# Patient Record
Sex: Male | Born: 1985 | Race: White | Hispanic: No | Marital: Single | Smoking: Current every day smoker
Health system: Southern US, Community
[De-identification: ages and names within clinical notes are randomized; demographics above are authoritative.]

## PROBLEM LIST (undated history)

## (undated) HISTORY — PX: TONSILLECTOMY: SUR1361

---
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
## 2009-08-22 ENCOUNTER — Emergency Department (HOSPITAL_COMMUNITY): Admission: EM | Admit: 2009-08-22 | Discharge: 2009-08-22 | Payer: Self-pay | Admitting: Emergency Medicine

## 2013-07-27 ENCOUNTER — Encounter (HOSPITAL_COMMUNITY): Payer: Self-pay | Admitting: *Deleted

## 2013-07-27 ENCOUNTER — Emergency Department (INDEPENDENT_AMBULATORY_CARE_PROVIDER_SITE_OTHER)
Admission: EM | Admit: 2013-07-27 | Discharge: 2013-07-27 | Disposition: A | Discharge status: Home / Self Care | Payer: Self-pay | Source: Home / Self Care | Attending: Family Medicine | Admitting: Family Medicine

## 2013-07-27 DIAGNOSIS — S61219A Laceration without foreign body of unspecified finger without damage to nail, initial encounter: Secondary | ICD-10-CM

## 2013-07-27 DIAGNOSIS — S61209A Unspecified open wound of unspecified finger without damage to nail, initial encounter: Secondary | ICD-10-CM

## 2013-07-27 DIAGNOSIS — Z23 Encounter for immunization: Secondary | ICD-10-CM

## 2013-07-27 MED ORDER — HYDROCODONE-ACETAMINOPHEN 5-325 MG PO TABS: 1.0000 | ORAL_TABLET | Freq: Four times a day (QID) | ORAL | Status: DC | PRN

## 2013-07-27 MED ORDER — TETANUS-DIPHTH-ACELL PERTUSSIS 5-2.5-18.5 LF-MCG/0.5 IM SUSP
0.5000 mL | Freq: Once | INTRAMUSCULAR | Status: AC
  Administered 2013-07-27: 0.5 mL via INTRAMUSCULAR

## 2013-07-27 MED ORDER — LIDOCAINE-EPINEPHRINE-TETRACAINE (LET) SOLUTION
3.0000 mL | Freq: Once | NASAL | Status: AC
  Administered 2013-07-27: 15:00:00 3 mL via TOPICAL

## 2013-07-27 MED ORDER — LIDOCAINE-EPINEPHRINE-TETRACAINE (LET) SOLUTION
NASAL | Status: AC
  Filled 2013-07-27: qty 3

## 2013-07-27 MED ORDER — TETANUS-DIPHTH-ACELL PERTUSSIS 5-2.5-18.5 LF-MCG/0.5 IM SUSP
INTRAMUSCULAR | Status: AC
  Filled 2013-07-27: qty 0.5

## 2013-07-27 NOTE — ED Notes (Signed)
LET applied to finger. Pt pain resolved almost immediately. Bleeding continues.

## 2013-07-27 NOTE — ED Notes (Signed)
Ice pack applied to right index finger.

## 2013-07-27 NOTE — ED Notes (Signed)
Pt states pain is slowly starting to return.

## 2013-07-27 NOTE — ED Notes (Signed)
Pt has laceration on index finger of right hand.  Pt states he cut his finger while chopping onions.

## 2013-07-27 NOTE — ED Provider Notes (Signed)
Travis Bright is a 27 y.o. male who presents to Urgent Care today for fingertip laceration. Patient was preparing a meal when he accidentally cut a piece of his left radial side index finger. He applied a compression dressing and used kitchen wrap to apply a tourniquet to his finger. This controlled the bleeding. He presented to the urgent care approximately 30 minutes later. He notes considerable pain. He denies any fevers nausea vomiting or diarrhea. He cannot recall his last tetanus shot.   History reviewed. No pertinent past medical history. History  Substance Use Topics  . Smoking status: Current Every Day Smoker -- 1.00 packs/day  . Smokeless tobacco: Not on file  . Alcohol Use: Yes   ROS as above Medications reviewed. No current facility-administered medications for this encounter.   Current Outpatient Prescriptions  Medication Sig Dispense Refill  . HYDROcodone-acetaminophen (NORCO/VICODIN) 5-325 MG per tablet Take 1 tablet by mouth every 6 (six) hours as needed for pain.  20 tablet  0    Exam:  BP 130/66  Pulse 78  Temp(Src) 98.1 F (36.7 C) (Oral)  Resp 16  SpO2 99%  Gen: Well NAD LEFT INDEX FINGER: Radial sided laceration involving the distal phalanx. It is approximately 1.5 cm long by 0.5 cm wide. There is no remaining skin flap.   Sensation is intact distally.   Wound care: LET was applied to control bleeding and for pain relief.  Hemostatic material was applied to the wound and a bulky compression dressing was applied.   Assessment and Plan: 27 y.o. male with nonrepairable left index fingertip laceration.  The wound edges are too far apart and there is no skin laxity at the fingertip. This wound will not be able to be repaired with primary intent.  Will allow to heal with secondary intent.  Provide tetanus shot and Norco for pain control.  Followup in one week or sooner if needed.  Discussed warning signs or symptoms. Please see discharge instructions.  Patient expresses understanding.      Rodolph Bong, MD 07/27/13 [id number]

## 2014-09-25 ENCOUNTER — Encounter (HOSPITAL_COMMUNITY): Payer: Self-pay | Admitting: Emergency Medicine

## 2014-09-25 ENCOUNTER — Emergency Department (INDEPENDENT_AMBULATORY_CARE_PROVIDER_SITE_OTHER)
Admission: EM | Admit: 2014-09-25 | Discharge: 2014-09-25 | Disposition: A | Discharge status: Home / Self Care | Payer: Self-pay | Source: Home / Self Care | Attending: Emergency Medicine | Admitting: Emergency Medicine

## 2014-09-25 DIAGNOSIS — S81812A Laceration without foreign body, left lower leg, initial encounter: Secondary | ICD-10-CM

## 2014-09-25 MED ORDER — CEPHALEXIN 500 MG PO CAPS: 500.0000 mg | ORAL_CAPSULE | Freq: Four times a day (QID) | ORAL | Status: DC

## 2014-09-25 NOTE — ED Notes (Signed)
Laceration to left calf, occurred last night with a kitchen knife.  Dropped knife and made contact with calf of leg

## 2014-09-25 NOTE — ED Provider Notes (Signed)
CSN: [id number]     Arrival date & time 09/25/14  1218 History   First MD Initiated Contact with Patient 09/25/14 1239     No chief complaint on file.  (Consider location/radiation/quality/duration/timing/severity/associated sxs/prior Treatment) HPI Comments: Dropped a knife last PM at 8 PM and it punctured and lacerated the skin over the left calf. Did not [facility]ek medical attn. Last TT this yr No signs of infection.     No past medical history on file. No past surgical history on file. No family history on file. History  Substance Use Topics  . Smoking status: Current Every Day Smoker -- 1.00 packs/day  . Smokeless tobacco: Not on file  . Alcohol Use: Yes    Review of Systems  Constitutional: Negative.   Musculoskeletal: Negative.   Skin: Positive for wound.    Allergies  Review of patient's allergies indicates not on file.  Home Medications   Prior to Admission medications   Medication Sig Start Date End Date Taking? Authorizing Provider  cephALEXin (KEFLEX) 500 MG capsule Take 1 capsule (500 mg total) by mouth 4 (four) times daily. 09/25/14   Hayden Rasmussenavid Korvin Valentine, NP  HYDROcodone-acetaminophen (NORCO/VICODIN) 5-325 MG per tablet Take 1 tablet by mouth every 6 (six) hours as needed for pain. 07/27/13   Rodolph BongEvan S Corey, MD   BP 124/74 mmHg  Pulse 72  Temp(Src) 98.5 F (36.9 C) (Oral)  Resp 16  SpO2 100% Physical Exam  Constitutional: He is oriented to person, place, and time. He appears well-developed and well-nourished. No distress.  Musculoskeletal:  No gastrocnemius tenderness. Full dorsi-plantar flexion without pain or weakness. No underlying ecchymosis or other discoloration.   Neurological: He is alert and oriented to person, place, and time.  Skin: Skin is warm and dry.  No swelling around the wound , no redness, drainage or bleeding. Shallow, 3cm length, 1.75 cm wide.  Psychiatric: He has a normal mood and affect.  Nursing note and vitals reviewed.   ED Course   LACERATION REPAIR Date/Time: 09/25/2014 1:15 PM Performed by: Phineas RealMABE, Kerianne Gurr Authorized by: Charm RingsHONIG, ERIN J Consent: Verbal consent obtained. Risks and benefits: risks, benefits and alternatives were discussed Consent given by: patient Patient identity confirmed: verbally with patient Body area: lower extremity Location details: left lower leg Laceration length: 2.8 cm Foreign bodies: no foreign bodies Tendon involvement: none Nerve involvement: none Vascular damage: no Patient sedated: no Irrigation solution: saline Irrigation method: syringe Amount of cleaning: standard Debridement: none Degree of undermining: none Approximation: loose Approximation difficulty: simple Comments: Steri strips to partially close the wound after copious irrigation and scrub.    (including critical care time) Labs Review Labs Reviewed - No data to display  Imaging Review No results found.   MDM   1. Laceration of left calf without complication, initial encounter    Steri stripped Bandaged Watch for infection Prophylactic abx with keflex x 3 d Return as needed     Hayden Rasmussenavid Jaileen Janelle, NP 09/25/14 1317

## 2014-09-25 NOTE — Discharge Instructions (Signed)
Non-Sutured Laceration A laceration is a cut or wound that goes through all layers of the skin and into the tissue just beneath the skin. Usually, these are stitched up or held together with tape or glue shortly after the injury occurred. However, if several or more hours have passed before getting care, too many germs (bacteria) get into the laceration. Stitching it closed would bring the risk of infection. If your health care provider feels your laceration is too old, it may be left open and then bandaged to allow healing from the bottom layer up. HOME CARE INSTRUCTIONS   Change the bandage (dressing) 2 times a day or as directed by your health care provider.  If the dressing or packing gauze sticks, soak it off with soapy water.  When you re-bandage your laceration, make sure that the dressing or packing gauze goes all the way to the bottom of the laceration. The top of the laceration is kept open so it can heal from the bottom up. There is less chance for infection with this method.  Wash the area with soap and water 2 times a day to remove all the creams or ointments, if used. Rinse off the soap. Pat the area dry with a clean towel. Look for signs of infection, such as redness, swelling, or a red line that goes away from the laceration.  Re-apply creams or ointments if they were used to bandage the laceration. This helps keep the bandage from sticking.  If the bandage becomes wet, dirty, or has a bad smell, change it as soon as possible.  Only take medicine as directed by your health care provider. You might need a tetanus shot now if:  You have no idea when you had the last one.  You have never had a tetanus shot before.  Your laceration had dirt in it.  Your laceration was dirty, and your last tetanus shot was more than 7 years ago.  Your laceration was clean, and your last tetanus shot was more than 10 years ago. If you need a tetanus shot, and you decide not to get one, there is  a rare chance of getting tetanus. Sickness from tetanus can be serious. If you got a tetanus shot, your arm may swell and get red and warm to the touch at the shot site. This is common and not a problem. SEEK MEDICAL CARE IF:   You have redness, swelling, or increasing pain in the laceration.  You notice a red line that goes away from your laceration.  You have pus coming from the laceration.  You have a fever.  You notice a bad smell coming from the laceration or dressing.  You notice something coming out of the laceration, such as wood or glass.  Your laceration is on your hand or foot and you are unable to properly move a finger or toe.  You have severe swelling around the laceration, causing pain and numbness.  You notice a change in color in your arm, hand, leg, or foot. MAKE SURE YOU:   Understand these instructions.  Will watch your condition.  Will get help right away if you are not doing well or get worse. Document Released: 09/25/2006 Document Revised: 11/02/2013 Document Reviewed: 04/17/2009 [facility] Patient Information 2015 [place], [place]. This information is not intended to replace advice given to you by your health care provider. Make sure you discuss any questions you have with your health care provider.  Puncture Wound A puncture wound is an injury  that extends through all layers of the skin and into the tissue beneath the skin (subcutaneous tissue). Puncture wounds become infected easily because germs often enter the body and go beneath the skin during the injury. Having a deep wound with a small entrance point makes it difficult for your caregiver to adequately clean the wound. This is especially true if you have stepped on a nail and it has passed through a dirty shoe or other situations where the wound is obviously contaminated. CAUSES  Many puncture wounds involve glass, nails, splinters, fish hooks, or other objects that enter the skin (foreign bodies). A  puncture wound may also be caused by a human bite or animal bite. DIAGNOSIS  A puncture wound is usually diagnosed by your history and a physical exam. You may need to have an X-ray or an ultrasound to check for any foreign bodies still in the wound. TREATMENT   Your caregiver will clean the wound as thoroughly as possible. Depending on the location of the wound, a bandage (dressing) may be applied.  Your caregiver might prescribe antibiotic medicines.  You may need a follow-up visit to check on your wound. Follow all instructions as directed by your caregiver. HOME CARE INSTRUCTIONS   Change your dressing once per day, or as directed by your caregiver. If the dressing sticks, it may be removed by soaking the area in water.  If your caregiver has given you follow-up instructions, it is very important that you return for a follow-up appointment. Not following up as directed could result in a chronic or permanent injury, pain, and disability.  Only take over-the-counter or prescription medicines for pain, discomfort, or fever as directed by your caregiver.  If you are given antibiotics, take them as directed. Finish them even if you start to feel better. You may need a tetanus shot if:  You cannot remember when you had your last tetanus shot.  You have never had a tetanus shot. If you got a tetanus shot, your arm may swell, get red, and feel warm to the touch. This is common and not a problem. If you need a tetanus shot and you choose not to have one, there is a rare chance of getting tetanus. Sickness from tetanus can be serious. You may need a rabies shot if an animal bite caused your puncture wound. SEEK MEDICAL CARE IF:   You have redness, swelling, or increasing pain in the wound.  You have red streaks going away from the wound.  You notice a bad smell coming from the wound or dressing.  You have yellowish-white fluid (pus) coming from the wound.  You are treated with an  antibiotic for infection, but the infection is not getting better.  You notice something in the wound, such as rubber from your shoe, cloth, or another object.  You have a fever.  You have severe pain.  You have difficulty breathing.  You feel dizzy or faint.  You cannot stop vomiting.  You lose feeling, develop numbness, or cannot move a limb below the wound.  Your symptoms worsen. MAKE SURE YOU:  Understand these instructions.  Will watch your condition.  Will get help right away if you are not doing well or get worse. Document Released: 08/07/2005 Document Revised: 01/20/2012 Document Reviewed: 04/16/2011 [facility]ExitCare Patient Information 2015 [employer], [place]. This information is not intended to replace advice given to you by your health care provider. Make sure you discuss any questions you have with your health care provider.

## 2015-05-02 ENCOUNTER — Emergency Department (HOSPITAL_COMMUNITY)
Admission: EM | Admit: 2015-05-02 | Discharge: 2015-05-02 | Disposition: A | Discharge status: Home / Self Care | Payer: Self-pay | Attending: Emergency Medicine | Admitting: Emergency Medicine

## 2015-05-02 ENCOUNTER — Emergency Department (HOSPITAL_COMMUNITY): Payer: Self-pay

## 2015-05-02 ENCOUNTER — Encounter (HOSPITAL_COMMUNITY): Payer: Self-pay | Admitting: Emergency Medicine

## 2015-05-02 DIAGNOSIS — Z792 Long term (current) use of antibiotics: Secondary | ICD-10-CM | POA: Insufficient documentation

## 2015-05-02 DIAGNOSIS — Y92000 Kitchen of unspecified non-institutional (private) residence as  the place of occurrence of the external cause: Secondary | ICD-10-CM | POA: Insufficient documentation

## 2015-05-02 DIAGNOSIS — Y999 Unspecified external cause status: Secondary | ICD-10-CM | POA: Insufficient documentation

## 2015-05-02 DIAGNOSIS — Y939 Activity, unspecified: Secondary | ICD-10-CM | POA: Insufficient documentation

## 2015-05-02 DIAGNOSIS — S3991XA Unspecified injury of abdomen, initial encounter: Secondary | ICD-10-CM | POA: Insufficient documentation

## 2015-05-02 DIAGNOSIS — Z720 Tobacco use: Secondary | ICD-10-CM | POA: Insufficient documentation

## 2015-05-02 DIAGNOSIS — W010XXA Fall on same level from slipping, tripping and stumbling without subsequent striking against object, initial encounter: Secondary | ICD-10-CM | POA: Insufficient documentation

## 2015-05-02 DIAGNOSIS — S2231XA Fracture of one rib, right side, initial encounter for closed fracture: Secondary | ICD-10-CM | POA: Insufficient documentation

## 2015-05-02 MED ORDER — IBUPROFEN 600 MG PO TABS: 600.0000 mg | ORAL_TABLET | Freq: Four times a day (QID) | ORAL | Status: AC | PRN

## 2015-05-02 MED ORDER — FENTANYL CITRATE (PF) 100 MCG/2ML IJ SOLN
50.0000 ug | Freq: Once | INTRAMUSCULAR | Status: AC
  Administered 2015-05-02: 50 ug via INTRAVENOUS
  Filled 2015-05-02: qty 2

## 2015-05-02 MED ORDER — SODIUM CHLORIDE 0.9 % IV SOLN
Freq: Once | INTRAVENOUS | Status: AC
  Administered 2015-05-02: 14:00:00 via INTRAVENOUS

## 2015-05-02 MED ORDER — OXYCODONE-ACETAMINOPHEN 5-325 MG PO TABS: 1.0000 | ORAL_TABLET | ORAL | Status: AC | PRN

## 2015-05-02 MED ORDER — OXYCODONE-ACETAMINOPHEN 5-325 MG PO TABS
1.0000 | ORAL_TABLET | Freq: Once | ORAL | Status: AC
  Administered 2015-05-02: 1 via ORAL
  Filled 2015-05-02: qty 1

## 2015-05-02 MED ORDER — IOHEXOL 300 MG/ML  SOLN
100.0000 mL | Freq: Once | INTRAMUSCULAR | Status: AC | PRN
  Administered 2015-05-02: 100 mL via INTRAVENOUS

## 2015-05-02 NOTE — Progress Notes (Signed)
CM spoke with pt who confirms self pay [place] resident with no pcp.  CM discussed and provided written information for self pay pcps, discussed the importance of pcp vs EDP services for f/u care, www.needymeds.org, www.goodrx.com, discounted pharmacies and other [place] resources such as [facility] , [facility], affordable care act,  [place] med assist, financial assistance, self pay dental services,  med assist, DSS and  health department  Reviewed resources for [place] self pay pcps like [facility], [facility] [address], [facility], [facility], local urgent care centers, [facility], [facility], general medical clinics, [facility], [facility] plus others, medication resources, [facility] out patient pharmacies and housing Pt voiced understanding and appreciation of resources provided   Provided [facility] contact information Pt agreed to a referral Cm completed referral Pt to be contact by [facility] clinical liason 

## 2015-05-02 NOTE — Discharge Instructions (Signed)
Ibuprofen for pain. Percocet for severe pain. Follow up with primary care doctor if not improving. Return if worsening symptoms.    Rib Fracture A rib fracture is a break or crack in one of the bones of the ribs. The ribs are a group of long, curved bones that wrap around your chest and attach to your spine. They protect your lungs and other organs in the chest cavity. A broken or cracked rib is often painful, but most do not cause other problems. Most rib fractures heal on their own over time. However, rib fractures can be more serious if multiple ribs are broken or if broken ribs move out of place and push against other structures. CAUSES   A direct blow to the chest. For example, this could happen during contact sports, a car accident, or a fall against a hard object.  Repetitive movements with high force, such as pitching a baseball or having severe coughing spells. SYMPTOMS   Pain when you breathe in or cough.  Pain when someone presses on the injured area. DIAGNOSIS  Your caregiver will perform a physical exam. Various imaging tests may be ordered to confirm the diagnosis and to look for related injuries. These tests may include a chest X-ray, computed tomography (CT), magnetic resonance imaging (MRI), or a bone scan. TREATMENT  Rib fractures usually heal on their own in 1-3 months. The longer healing period is often associated with a continued cough or other aggravating activities. During the healing period, pain control is very important. Medication is usually given to control pain. Hospitalization or surgery may be needed for more severe injuries, such as those in which multiple ribs are broken or the ribs have moved out of place.  HOME CARE INSTRUCTIONS   Avoid strenuous activity and any activities or movements that cause pain. Be careful during activities and avoid bumping the injured rib.  Gradually increase activity as directed by your caregiver.  Only take over-the-counter or  prescription medications as directed by your caregiver. Do not take other medications without asking your caregiver first.  Apply ice to the injured area for the first 1-2 days after you have been treated or as directed by your caregiver. Applying ice helps to reduce inflammation and pain.  Put ice in a plastic bag.  Place a towel between your skin and the bag.   Leave the ice on for 15-20 minutes at a time, every 2 hours while you are awake.  Perform deep breathing as directed by your caregiver. This will help prevent pneumonia, which is a common complication of a broken rib. Your caregiver may instruct you to:  Take deep breaths several times a day.  Try to cough several times a day, holding a pillow against the injured area.  Use a device called an incentive spirometer to practice deep breathing several times a day.  Drink enough fluids to keep your urine clear or pale yellow. This will help you avoid constipation.   Do not wear a rib belt or binder. These restrict breathing, which can lead to pneumonia.  SEEK IMMEDIATE MEDICAL CARE IF:   You have a fever.   You have difficulty breathing or shortness of breath.   You develop a continual cough, or you cough up thick or bloody sputum.  You feel sick to your stomach (nausea), throw up (vomit), or have abdominal pain.   You have worsening pain not controlled with medications.  MAKE SURE YOU:  Understand these instructions.  Will watch your condition.  Will get help right away if you are not doing well or get worse. Document Released: 10/28/2005 Document Revised: 06/30/2013 Document Reviewed: 12/30/2012 [facility] Patient Information 2015 [place], [place]. This information is not intended to replace advice given to you by your health care provider. Make sure you discuss any questions you have with your health care provider.

## 2015-05-02 NOTE — ED Provider Notes (Signed)
CSN: [id number]     Arrival date & time 05/02/15  1148 History  This chart was scribed for non-physician practitioner, Jaynie Crumble, PA-C working with Raeford Razor, MD by Placido Sou, ED scribe. This patient was seen in room WTR6/WTR6 and the patient's care was started at 12:24 PM.    Chief Complaint  Patient presents with  . Rib Injury    pain in r/rib   The history is provided by the patient. No language interpreter was used.    HPI Comments: Travis Bright is a 29 y.o. male who presents to the Emergency Department complaining of a fall that occurred PTA. He notes slipping in his kitchen and landing on his right arm and ribs and felt a "pop" upon impact. Pt notes constant, moderate, right rib pain, RUQ pain and SOB as associated symptoms but denies pain to the right arm. He notes a worsening of symptoms with any type of ambulation. Pt denies a LOC, any trauma to the head, or any other associated symptoms.   No past medical history on file. No past surgical history on file. No family history on file. History  Substance Use Topics  . Smoking status: Current Every Day Smoker -- 1.00 packs/day  . Smokeless tobacco: Not on file  . Alcohol Use: Yes    Review of Systems  Musculoskeletal: Positive for myalgias and arthralgias.  Skin: Negative for wound.  Neurological: Negative for syncope.      Allergies  Review of patient's allergies indicates no known allergies.  Home Medications   Prior to Admission medications   Medication Sig Start Date End Date Taking? Authorizing Provider  cephALEXin (KEFLEX) 500 MG capsule Take 1 capsule (500 mg total) by mouth 4 (four) times daily. 09/25/14   Hayden Rasmussen, NP  HYDROcodone-acetaminophen (NORCO/VICODIN) 5-325 MG per tablet Take 1 tablet by mouth every 6 (six) hours as needed for pain. 07/27/13   Rodolph Bong, MD   There were no vitals taken for this visit. Physical Exam  Constitutional: He is oriented to person, place, and time.  He appears well-developed and well-nourished. No distress.  HENT:  Head: Normocephalic and atraumatic.  Mouth/Throat: Oropharynx is clear and moist.  Eyes: Conjunctivae and EOM are normal. Pupils are equal, round, and reactive to light.  Neck: Normal range of motion. Neck supple. No tracheal deviation present.  Cardiovascular: Normal rate, regular rhythm and normal heart sounds.   Pulmonary/Chest: Effort normal and breath sounds normal. No respiratory distress. He has no wheezes. He exhibits tenderness.  TTP over right lower ribs. No deformity, bruising, swelling  Abdominal: Soft. Bowel sounds are normal. There is tenderness. There is no rebound and no guarding.  RUQ tenderness  Musculoskeletal:  Normal right arm, with no bruising, swelling, TTP, full rom of all joints  Neurological: He is alert and oriented to person, place, and time.  Skin: Skin is warm and dry.  Psychiatric: He has a normal mood and affect. His behavior is normal.  Nursing note and vitals reviewed.   ED Course  Procedures  DIAGNOSTIC STUDIES: Oxygen Saturation is 99% on RA, normal by my interpretation.    COORDINATION OF CARE: 12:27 PM Discussed treatment plan with pt at bedside and pt agreed to plan.  1:05 PM Re-assessed patient and he continues to note RUQ pain. CT scan is being ran to rule out any kidney or liver injuries.   Labs Review Labs Reviewed - No data to display  Imaging Review Dg Ribs Unilateral W/chest Right  05/02/2015  CLINICAL DATA:  Slipped and fell on RIGHT side today, RIGHT anterolateral rib pain, shortness of breath  EXAM: RIGHT RIBS AND CHEST - 3+ VIEW  COMPARISON:  None  FINDINGS: Normal heart size and mediastinal contours.  Upper normal size of LEFT hilum.  Mediastinal contours otherwise normal.  Peribronchial thickening without infiltrate, pleural effusion or pneumothorax.  Osseous mineralization normal.  Questionable subtle contour abnormality of the lateral RIGHT sixth rib cannot  exclude nondisplaced fracture.  IMPRESSION: Bronchitic changes.  Questionable nondisplaced lateral RIGHT sixth rib fracture.   Electronically Signed   By: Ulyses Southward M.D.   On: 05/02/2015 12:39   Ct Abdomen Pelvis W Contrast  05/02/2015   CLINICAL DATA:  Pain following fall. Pain primarily in right upper quadrant region  EXAM: CT ABDOMEN AND PELVIS WITH CONTRAST  TECHNIQUE: Multidetector CT imaging of the abdomen and pelvis was performed using the standard protocol following bolus administration of intravenous contrast.  CONTRAST:  OMNIPAQUE IOHEXOL 300 MG/ML  SOLN  COMPARISON:  None.  FINDINGS: Lung bases are clear.  No lung base pneumothorax.  Liver is prominent, measuring 20.8 cm in length. There is hepatic steatosis. There is no liver laceration or rupture. There is no perihepatic fluid. No focal liver lesions are identified. The gallbladder wall is not thickened. There is no biliary duct dilatation.  Spleen appears intact without laceration or rupture. No perisplenic fluid. No focal splenic lesions are identified.  Pancreas and adrenals appear normal.  Kidneys bilaterally show no mass or hydronephrosis. There is no renal or ureteral calculus. There is no renal contusion or laceration. There is no perinephric fluid or contrast extravasation.  In the pelvis, the urinary bladder is midline with normal wall thickness. There is no pelvic mass or pelvic fluid collection. The appendix appears normal.  There is no bowel obstruction. No free air or portal venous air. There is no bowel wall thickening or mesenteric thickening.  There is no ascites, adenopathy, or abscess in the abdomen or pelvis. Aorta appears normal and intact. There is no periaortic fluid. There is no demonstrable abdominal or pelvic wall lesion.  No fractures are apparent on this study. There are no blastic or lytic bone lesions.  IMPRESSION: Prominent liver with hepatic steatosis. No traumatic appearing visceral lesions are identified.  No  abnormal fluid collections. No inflammatory lesion identified in the abdomen or pelvis. No fractures are identified. Lung bases are clear.  Appendix appears normal. No bowel obstruction. No renal or ureteral calculus. No hydronephrosis.   Electronically Signed   By: Bretta Bang III M.D.   On: 05/02/2015 14:26     EKG Interpretation None      MDM   Final diagnoses:  Rib fracture, right, closed, initial encounter    Patient here after a fall at home, injury to the right lower ribs. Chest x-ray suspicious for sixth rib fracture. Patient continues to have severe pain after Percocet, reexamined the patient, continues to have right upper quadrant pain and tenderness. We'll get CT for further evaluation to rule out intra-abdominal injury. Discussed with Dr. Juleen China, agrees.    2:34 PM CT negative for any intra-abdominal trauma. Incentive spirometer given. Home with Percocet and ibuprofen for probable right rib fracture. Return precautions discussed. Vital signs are normal prior to discharge.  Filed Vitals:   05/02/15 1202  BP: 119/70  Pulse: 88  Temp: 98.6 F (37 C)  TempSrc: Oral  Resp: 20  Weight: 220 lb (99.791 kg)  SpO2: 99%  Jaynie Crumble, PA-C 05/02/15 1434  Raeford Razor, MD 05/03/15 [phone]

## 2015-05-02 NOTE — ED Notes (Signed)
Pt c/o severe r/rib pain. Pt stated that she slipped and fell, pressure on r/rib caused a popping noise. Pt stated that when he takes a deep breath the pain ins 10/10. No pain when he does not move. Denies LOC, did not strike his head

## 2016-03-20 IMAGING — CR DG RIBS W/ CHEST 3+V*R*
4 series · 4 of 4 positions shown · non-contrast
Comparison: None

CLINICAL DATA: Slipped and fell on RIGHT side today, RIGHT
anterolateral rib pain, shortness of breath

EXAM:
RIGHT RIBS AND CHEST - 3+ VIEW

[w chest pa]
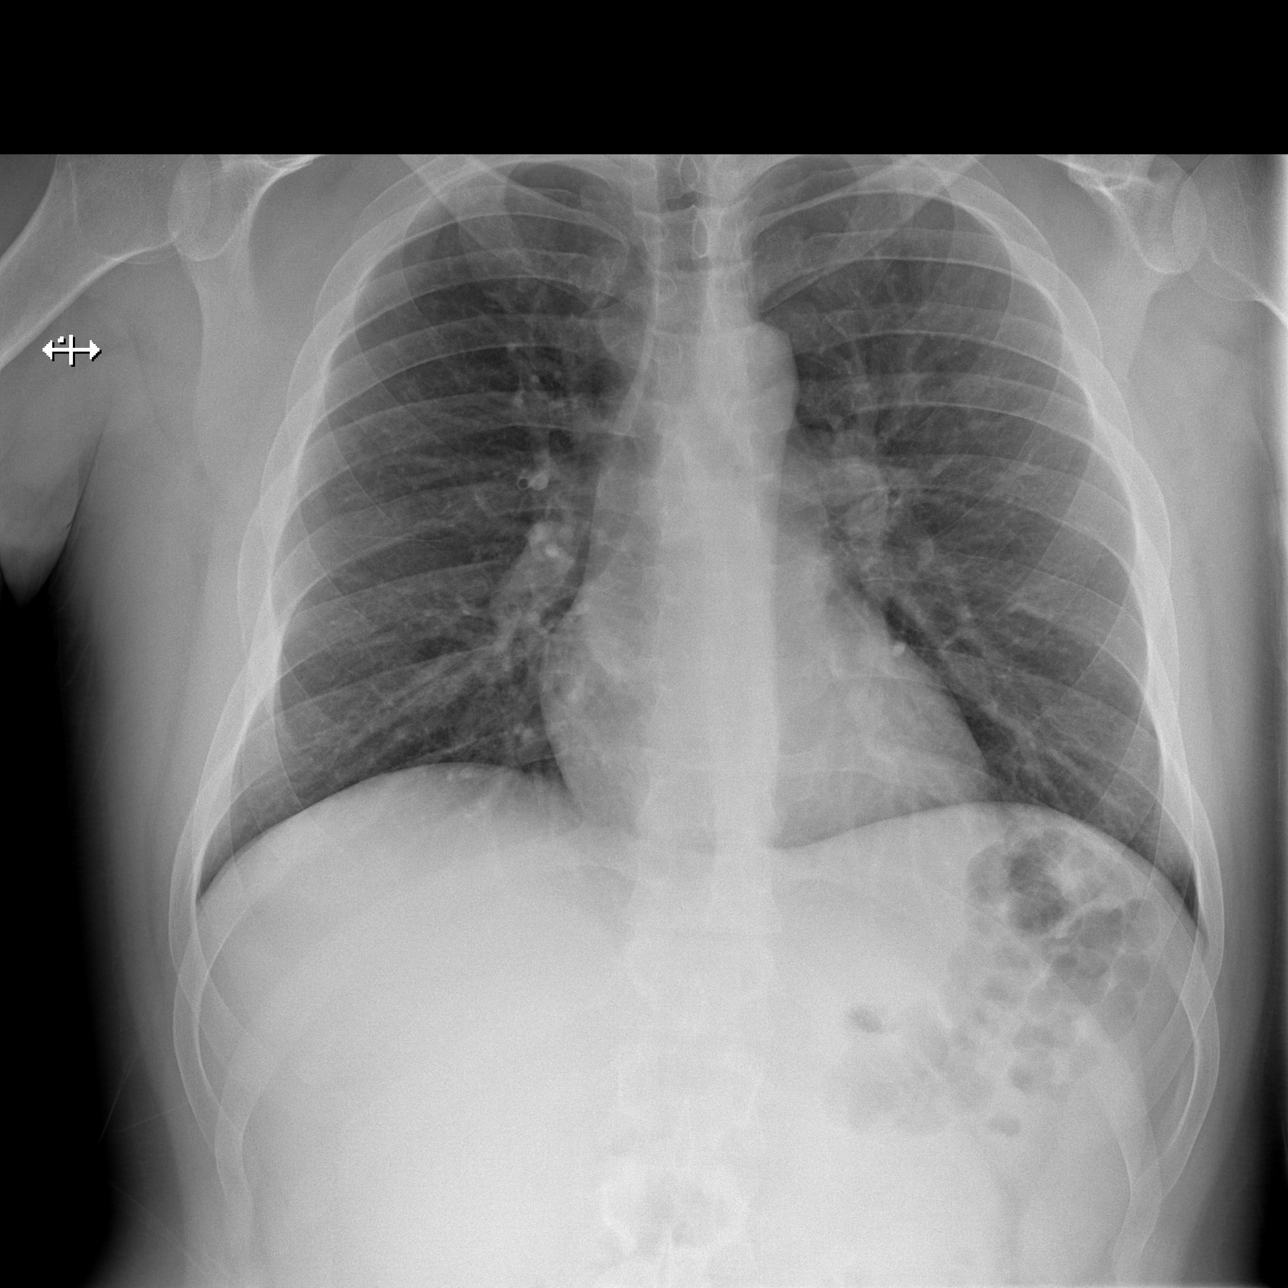

[w ribs ap upper right]
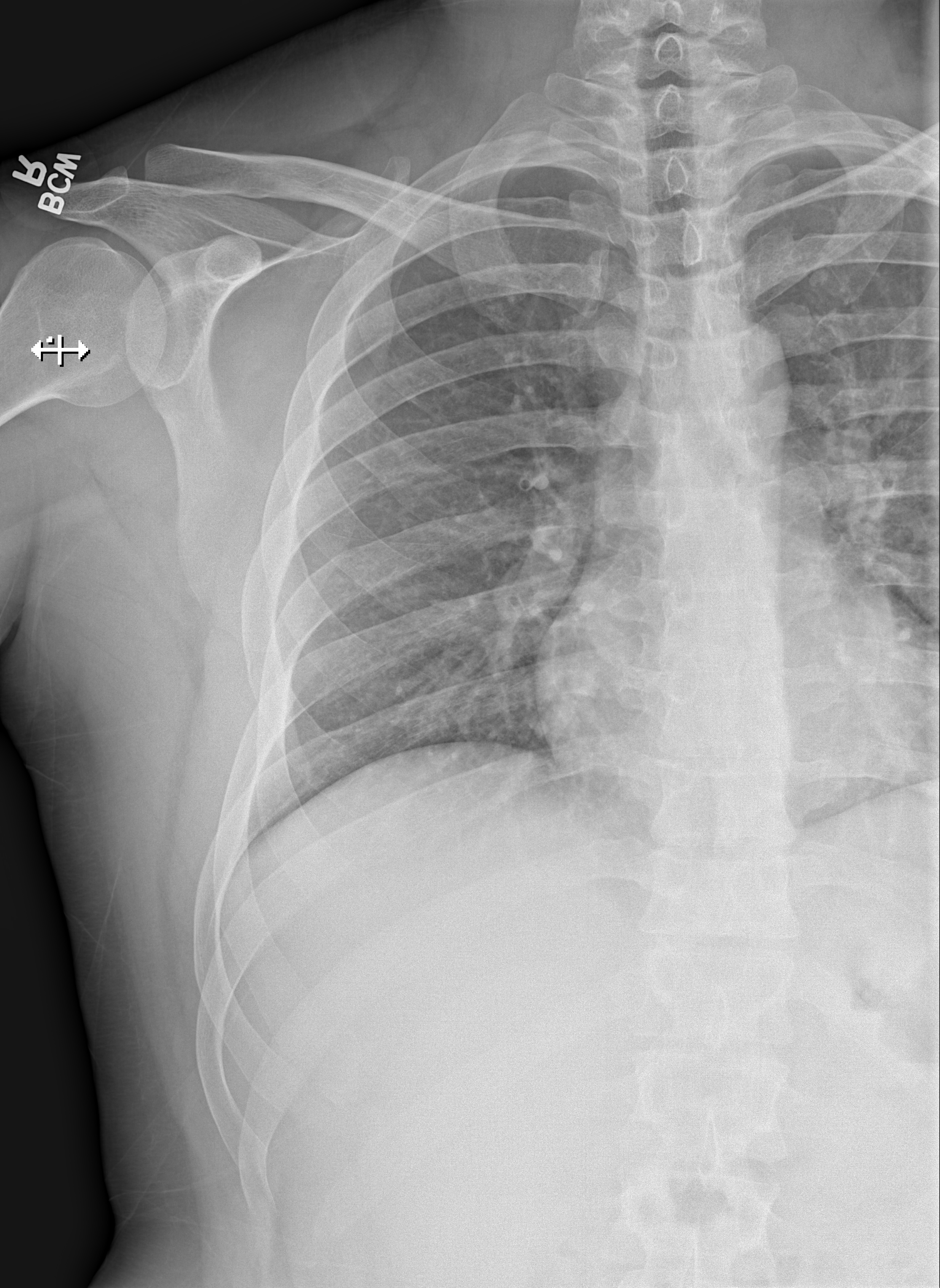

[w ribs ap lower right]
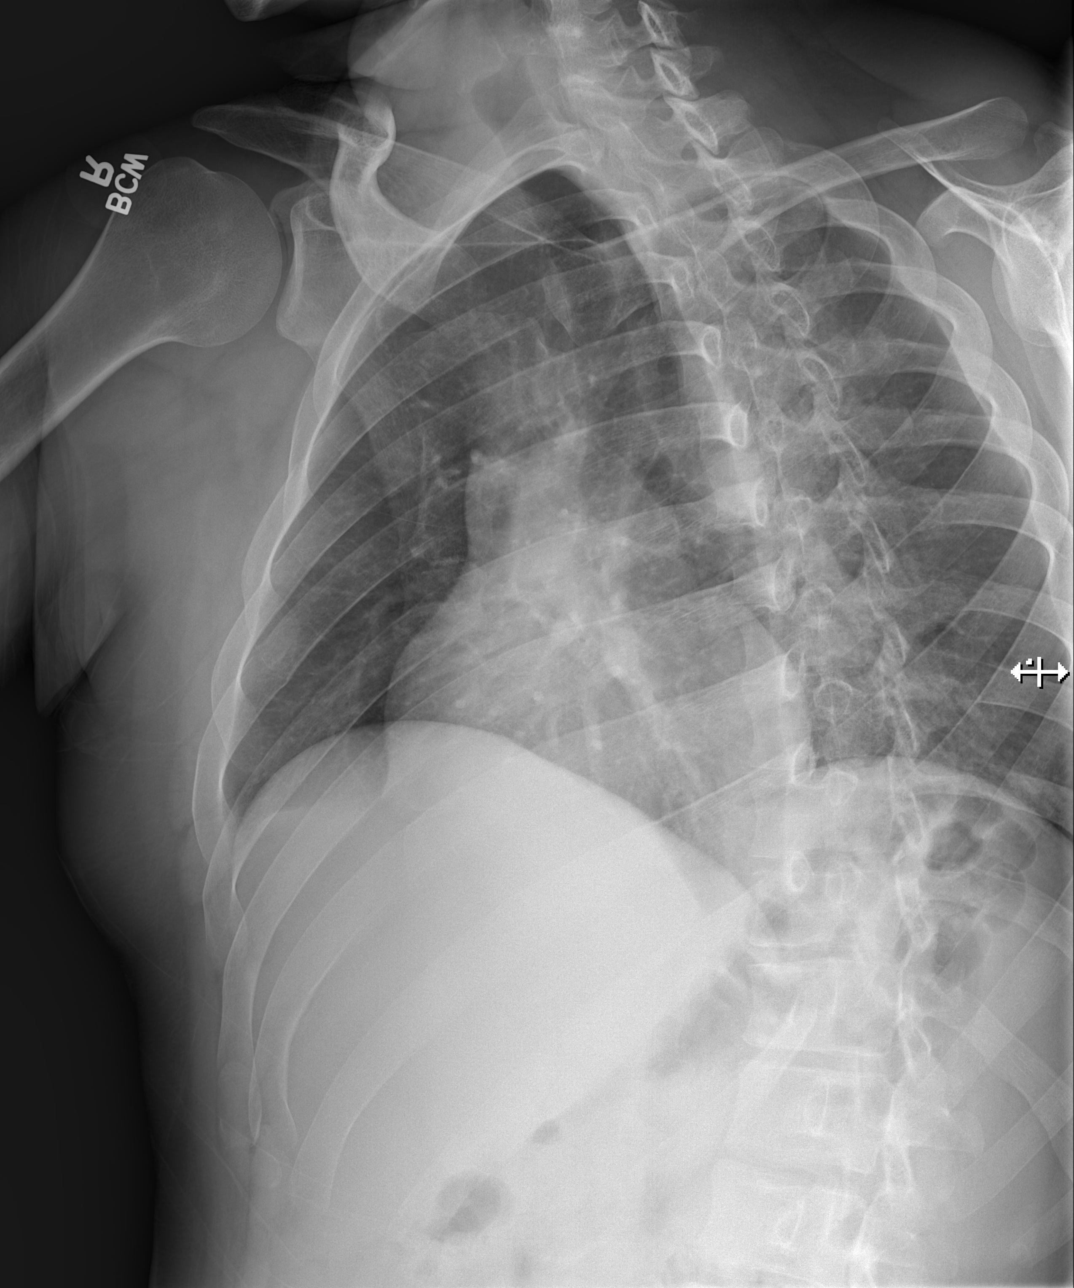

[w ribs obl right]
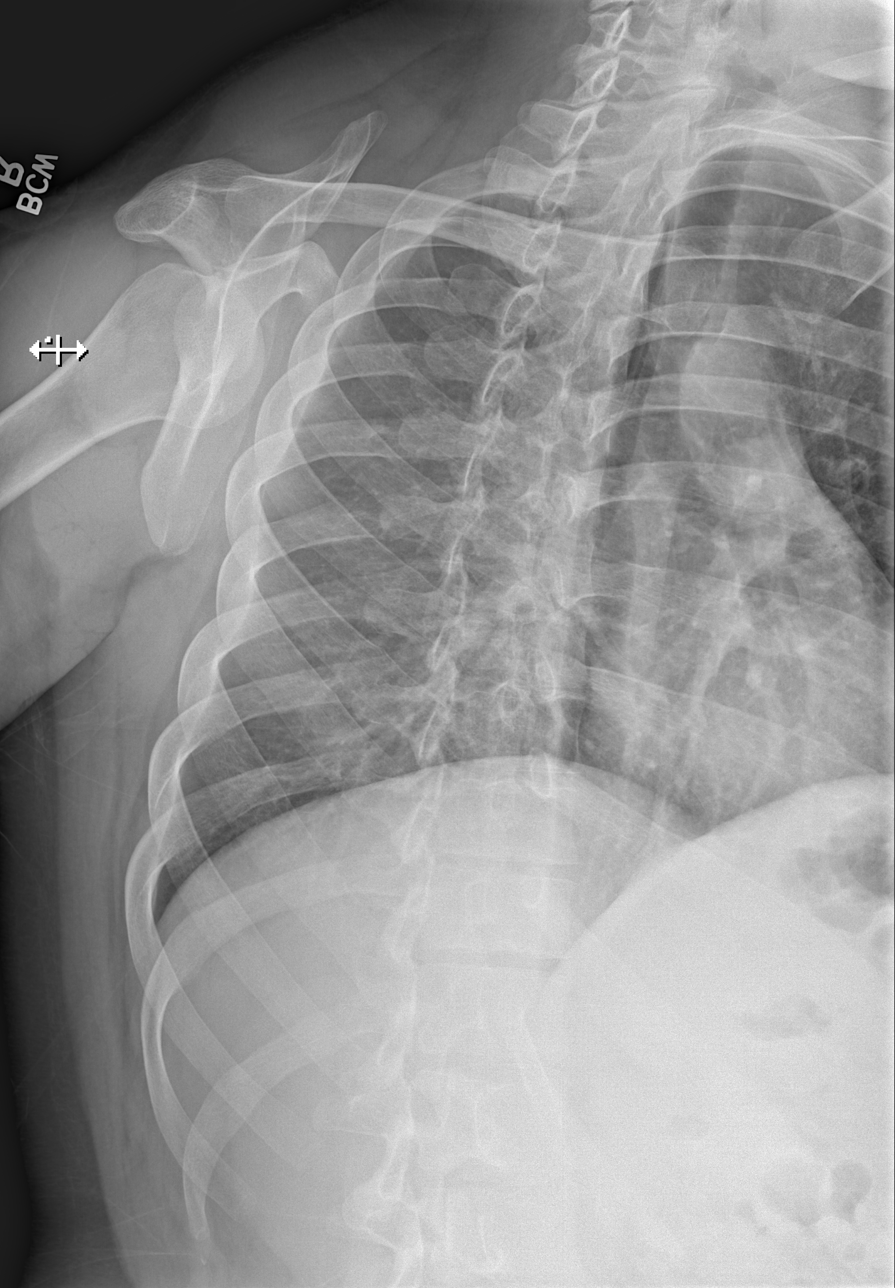

[4 of 4 positions shown; findings below may reference images not displayed]

FINDINGS: Normal heart size and mediastinal contours.

Upper normal size of LEFT hilum.

Mediastinal contours otherwise normal.

Peribronchial thickening without infiltrate, pleural effusion or
pneumothorax.

Osseous mineralization normal.

Questionable subtle contour abnormality of the lateral RIGHT sixth
rib cannot exclude nondisplaced fracture.
IMPRESSION: Bronchitic changes.

Questionable nondisplaced lateral RIGHT sixth rib fracture.

## 2017-03-15 ENCOUNTER — Emergency Department (HOSPITAL_COMMUNITY)
Admission: EM | Admit: 2017-03-15 | Discharge: 2017-03-15 | Disposition: A | Discharge status: Home / Self Care | Payer: Self-pay | Attending: Emergency Medicine | Admitting: Emergency Medicine

## 2017-03-15 ENCOUNTER — Encounter (HOSPITAL_COMMUNITY): Payer: Self-pay

## 2017-03-15 DIAGNOSIS — F17200 Nicotine dependence, unspecified, uncomplicated: Secondary | ICD-10-CM | POA: Insufficient documentation

## 2017-03-15 DIAGNOSIS — R21 Rash and other nonspecific skin eruption: Secondary | ICD-10-CM | POA: Insufficient documentation

## 2017-03-15 DIAGNOSIS — Z79899 Other long term (current) drug therapy: Secondary | ICD-10-CM | POA: Insufficient documentation

## 2017-03-15 MED ORDER — METHYLPREDNISOLONE SODIUM SUCC 125 MG IJ SOLR
125.0000 mg | Freq: Once | INTRAMUSCULAR | Status: AC
  Administered 2017-03-15: 125 mg via INTRAMUSCULAR
  Filled 2017-03-15: qty 2

## 2017-03-15 MED ORDER — PREDNISONE 10 MG (21) PO TBPK: ORAL_TABLET | Freq: Every day | ORAL | 0 refills | Status: AC

## 2017-03-15 NOTE — Discharge Instructions (Signed)
Take prednisone Pak as directed on package starting tomorrow. Follow-up with PCP for further evaluation if needed. Return to ED for worsening rash, trouble breathing, trouble swallowing, no improvement, additional exposure to poison ivy.

## 2017-03-15 NOTE — ED Provider Notes (Signed)
[facility]-EMERGENCY DEPT Provider Note   CSN: [id number] Arrival date & time: 03/15/17  1332   By signing my name below, I, Avnee Patel, attest that this documentation has been prepared under the direction and in the presence of   Xoe Hoe, PA-C. Electronically Signed: Clovis PuAvnee Patel, ED Scribe. 03/15/17. 2:45 PM.   History   Chief Complaint Chief Complaint  Patient presents with  . Rash    HPI Comments:  Travis Bright is a 31 y.o. male who presents to the Emergency Department complaining of acute onset, gradually worsening rash secondary to poison ivy to his lower extremities and left upper extremity beginning 1 week ago. He reports he had patch on his face which self resolved. Pt notes a hx of poison ivy and states he has been to the hospital in the past and was treated with cortisone shot. Pt states he has applied Cortisone cream with no relief. He has also applied bleach to the affected areas which he knows he should not have done. Pt denies trouble breathing, chest pain, nausea, vomiting or any other associated symptoms. No other complaints noted at this time.   The history is provided by the patient. No language interpreter was used.    History reviewed. No pertinent past medical history.  There are no active problems to display for this patient.   Past Surgical History:  Procedure Laterality Date  . TONSILLECTOMY      Home Medications    Prior to Admission medications   Medication Sig Start Date End Date Taking? Authorizing Provider  ibuprofen (ADVIL,MOTRIN) 600 MG tablet Take 1 tablet (600 mg total) by mouth every 6 (six) hours as needed. 05/02/15   Kirichenko, Lemont Fillersatyana, PA-C  oxyCODONE-acetaminophen (PERCOCET) 5-325 MG per tablet Take 1 tablet by mouth every 4 (four) hours as needed for severe pain. 05/02/15   Kirichenko, Tatyana, PA-C  predniSONE (STERAPRED UNI-PAK 21 TAB) 10 MG (21) TBPK tablet Take by mouth daily. Take 6 tabs by mouth daily  for 2 days, then 5 tabs for  2 days, then 4 tabs for 2 days, then 3 tabs for 2 days, 2 tabs for 2 days, then 1 tab by mouth daily for 2 days 03/15/17   Dietrich PatesKhatri, Yuya Vanwingerden, PA-C    Family History No family history on file.  Social History Social History  Substance Use Topics  . Smoking status: Current Every Day Smoker    Packs/day: 1.00  . Smokeless tobacco: Not on file  . Alcohol use Yes     Allergies   Patient has no known allergies.   Review of Systems Review of Systems  HENT: Negative for drooling.   Respiratory: Negative for choking, chest tightness and shortness of breath.   Cardiovascular: Negative for chest pain.  Gastrointestinal: Negative for nausea and vomiting.  Skin: Positive for rash.     Physical Exam Updated Vital Signs BP 110/70 (BP Location: Left Arm)   Pulse 83   Temp 98.4 F (36.9 C) (Oral)   Resp 18   SpO2 99%   Physical Exam  Constitutional: He appears well-developed and well-nourished. No distress.  HENT:  Head: Normocephalic and atraumatic.  Eyes: Conjunctivae and EOM are normal. No scleral icterus.  Neck: Normal range of motion.  Cardiovascular: Normal rate and regular rhythm.   Pulmonary/Chest: Effort normal. No respiratory distress.  No signs of respiratory distress or airway compromise. Able to tolerate secretions.  Neurological: He is alert.  Skin: Rash noted. He is not diaphoretic.  Multiple erythematous pruritic lesions  on both lower extremities L>R and L forearm. No urticaria. No drainage from lesions. L ankle mildly swollen due to lesions.  Psychiatric: He has a normal mood and affect.  Nursing note and vitals reviewed.    ED Treatments / Results  DIAGNOSTIC STUDIES:  Oxygen Saturation is 99% on RA, normal by my interpretation.    COORDINATION OF CARE:  2:41 PM Discussed treatment plan with pt at bedside and pt agreed to plan.  Labs (all labs ordered are listed, but only abnormal results are displayed) Labs Reviewed - No data to display  EKG  EKG  Interpretation None       Radiology No results found.  Procedures Procedures (including critical care time)  Medications Ordered in ED Medications  methylPREDNISolone sodium succinate (SOLU-MEDROL) 125 mg/2 mL injection 125 mg (125 mg Intramuscular Given 03/15/17 1456)     Initial Impression / Assessment and Plan / ED Course  I have reviewed the triage vital signs and the nursing notes.  Pertinent labs & imaging results that were available during my care of the patient were reviewed by me and considered in my medical decision making (see chart for details).     Patient's history and symptoms concerning for contact dermatitis due to poison heavy. States that he has a history of exposure with similar symptoms. He does not appear to be in respiratory distress and is tolerating secretions at this time. States that symptoms have gotten worse over time in none of his home measures up work. Will give a shot of Solu-Medrol in office and advise him to begin his steroid taper tomorrow. Continue symptomatic treatment with cool compresses, oatmeal baths. Strict return precautions were given.  Final Clinical Impressions(s) / ED Diagnoses   Final diagnoses:  Rash    New Prescriptions New Prescriptions   PREDNISONE (STERAPRED UNI-PAK 21 TAB) 10 MG (21) TBPK TABLET    Take by mouth daily. Take 6 tabs by mouth daily  for 2 days, then 5 tabs for 2 days, then 4 tabs for 2 days, then 3 tabs for 2 days, 2 tabs for 2 days, then 1 tab by mouth daily for 2 days   I personally performed the services described in this documentation, which was scribed in my presence. The recorded information has been reviewed and is accurate.     Dietrich Pates, PA-C 03/15/17 1459    Lorre Nick, MD 03/19/17 [phone]

## 2017-03-15 NOTE — ED Triage Notes (Signed)
He c/o pruritic rash at extremities which is in appearance a typical rhus-type rash.

## 2017-03-26 ENCOUNTER — Emergency Department (HOSPITAL_COMMUNITY)
Admission: EM | Admit: 2017-03-26 | Discharge: 2017-03-26 | Disposition: A | Discharge status: Home / Self Care | Payer: Self-pay | Attending: Emergency Medicine | Admitting: Emergency Medicine

## 2017-03-26 ENCOUNTER — Encounter (HOSPITAL_COMMUNITY): Payer: Self-pay | Admitting: Emergency Medicine

## 2017-03-26 DIAGNOSIS — Z872 Personal history of diseases of the skin and subcutaneous tissue: Secondary | ICD-10-CM

## 2017-03-26 DIAGNOSIS — F17200 Nicotine dependence, unspecified, uncomplicated: Secondary | ICD-10-CM | POA: Insufficient documentation

## 2017-03-26 DIAGNOSIS — L740 Miliaria rubra: Secondary | ICD-10-CM | POA: Insufficient documentation

## 2017-03-26 MED ORDER — TRIAMCINOLONE ACETONIDE 0.1 % EX CREA: 1.0000 "application " | TOPICAL_CREAM | Freq: Two times a day (BID) | CUTANEOUS | 0 refills | Status: AC

## 2017-03-26 NOTE — ED Triage Notes (Signed)
Pt states he was here last week for a rash (poison ivy).  Was given an injection and pills to take at home and when he ran out of medication, it spread to other areas.

## 2017-03-26 NOTE — ED Provider Notes (Signed)
[facility]-EMERGENCY DEPT Provider Note   CSN: [id number] Arrival date & time: 03/26/17  1514     History   Chief Complaint Chief Complaint  Patient presents with  . Rash    HPI Travis Bright is a 31 y.o. male.  The history is provided by the patient.  Rash   This is a recurrent problem. Episode onset: 1.5 weeks. Associated with: Working out in the heat with gloves and boots. There has been no fever. Affected Location: Bilateral hands and feet. The patient is experiencing no pain. The pain has been constant since onset. Associated symptoms include itching. He has tried nothing for the symptoms.   Patient did report that he had a contact dermatitis from poison ivy 2 weeks ago for which he was treated with prednisone taper. This rash is different from the contact dermatitis and states that he's had it previously.   History reviewed. No pertinent past medical history.  There are no active problems to display for this patient.   Past Surgical History:  Procedure Laterality Date  . TONSILLECTOMY         Home Medications    Prior to Admission medications   Medication Sig Start Date End Date Taking? Authorizing Provider  ibuprofen (ADVIL,MOTRIN) 600 MG tablet Take 1 tablet (600 mg total) by mouth every 6 (six) hours as needed. 05/02/15   Kirichenko, Lemont Fillersatyana, PA-C  oxyCODONE-acetaminophen (PERCOCET) 5-325 MG per tablet Take 1 tablet by mouth every 4 (four) hours as needed for severe pain. 05/02/15   Kirichenko, Tatyana, PA-C  predniSONE (STERAPRED UNI-PAK 21 TAB) 10 MG (21) TBPK tablet Take by mouth daily. Take 6 tabs by mouth daily  for 2 days, then 5 tabs for 2 days, then 4 tabs for 2 days, then 3 tabs for 2 days, 2 tabs for 2 days, then 1 tab by mouth daily for 2 days 03/15/17   Idelle LeechKhatri, Hina, PA-C  triamcinolone cream (KENALOG) 0.1 % Apply 1 application topically 2 (two) times daily. 03/26/17   Nira Connardama, Eagan Shifflett Eduardo, MD    Family History No family history on file.  Social  History Social History  Substance Use Topics  . Smoking status: Current Every Day Smoker    Packs/day: 1.00  . Smokeless tobacco: Not on file  . Alcohol use Yes     Allergies   Patient has no known allergies.   Review of Systems Review of Systems  Skin: Positive for itching and rash.  All other systems are reviewed and are negative for acute change except as noted in the HPI    Physical Exam Updated Vital Signs BP (!) 144/78 (BP Location: Right Arm)   Pulse (!) 106   Resp 18   SpO2 98%   Physical Exam  Constitutional: He is oriented to person, place, and time. He appears well-developed and well-nourished. No distress.  HENT:  Head: Normocephalic and atraumatic.  Nose: Nose normal.  Eyes: Conjunctivae and EOM are normal. Pupils are equal, round, and reactive to light. Right eye exhibits no discharge. Left eye exhibits no discharge. No scleral icterus.  Neck: Normal range of motion. Neck supple.  Cardiovascular: Normal rate and regular rhythm.  Exam reveals no gallop and no friction rub.   No murmur heard. Pulmonary/Chest: Effort normal and breath sounds normal. No stridor. No respiratory distress. He has no rales.  Abdominal: Soft. He exhibits no distension. There is no tenderness.  Musculoskeletal: He exhibits no edema or tenderness.  Neurological: He is alert and oriented to person, place,  and time.  Skin: Skin is warm and dry. Rash noted. Rash is papular (2 bilateral hands with dispersed clear vesicular papules. No surrounding erythema, weeping, or drainage.) and maculopapular (To dorsum of bilateral feet, blanching. No weeping, no superimposed infection.). He is not diaphoretic. No erythema.  Psychiatric: He has a normal mood and affect.  Vitals reviewed.    ED Treatments / Results  Labs (all labs ordered are listed, but only abnormal results are displayed) Labs Reviewed - No data to display  EKG  EKG Interpretation None       Radiology No results  found.  Procedures Procedures (including critical care time)  Medications Ordered in ED Medications - No data to display   Initial Impression / Assessment and Plan / ED Course  I have reviewed the triage vital signs and the nursing notes.  Pertinent labs & imaging results that were available during my care of the patient were reviewed by me and considered in my medical decision making (see chart for details).     Presentation consistent with heat rash versus dyshidrotic eczema. The patient also has evidence of resolving contact dermatitis on bilateral forearms and lower extremities which is not associated with the new rash. No evidence of superimposed infection on either of the rashes. Discussed symptomatic treatment and appropriate working habits to decreased moisture control. We'll also provide patient with a topical steroid.  The patient is safe for discharge with strict return precautions.   Final Clinical Impressions(s) / ED Diagnoses   Final diagnoses:  H/O contact dermatitis  Heat rash   Disposition: Discharge  Condition: Good  I have discussed the results, Dx and Tx plan with the patient who expressed understanding and agree(s) with the plan. Discharge instructions discussed at great length. The patient was given strict return precautions who verbalized understanding of the instructions. No further questions at time of discharge.    New Prescriptions   TRIAMCINOLONE CREAM (KENALOG) 0.1 %    Apply 1 application topically 2 (two) times daily.    Follow Up: Primary care provider  Schedule an appointment as soon as possible for a visit  As needed      Valdez Brannan, Amadeo Garnet, MD 03/26/17 [phone]
